# Patient Record
Sex: Male | Born: 1985 | Race: White | Hispanic: No | Marital: Single
Health system: Midwestern US, Community
[De-identification: ages and names within clinical notes are randomized; demographics above are authoritative.]

---
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
## 2021-08-08 DIAGNOSIS — R062 Wheezing: Secondary | ICD-10-CM

## 2021-08-08 NOTE — ED Provider Notes (Cosign Needed)
HPI  36 year old male.  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  Feels like he has been wheezing since working on his farm yesterday.  Does not have an albuterol inhaler at home.  Otherwise denies chest pain, fever/chills, GI symptoms.    No past medical history on file.     No past surgical history on file.        No family history on file.      Social History     Socioeconomic History    Marital status: Single     Spouse name: Not on file    Number of children: Not on file    Years of education: Not on file    Highest education level: Not on file   Occupational History    Not on file   Tobacco Use    Smoking status: Not on file    Smokeless tobacco: Not on file   Substance and Sexual Activity    Alcohol use: Not on file    Drug use: Not on file    Sexual activity: Not on file   Other Topics Concern    Not on file   Social History Narrative    Not on file     Social Determinants of Health     Financial Resource Strain: Not on file   Food Insecurity: Not on file   Transportation Needs: Not on file   Physical Activity: Not on file   Stress: Not on file   Social Connections: Not on file   Intimate Partner Violence: Not on file   Housing Stability: Not on file         ALLERGIES: Patient has no known allergies.      Vitals:    08/08/21 2346 08/08/21 2347   BP:  (!) 141/88   Pulse:  89   Resp:  20   Temp:  98 F (36.7 C)   TempSrc:  Oral   SpO2:  97%   Weight: 210 lb (95.3 kg)    Height: 6\' 2"  (1.88 m)             Physical Exam   Gen: Well-appearing male in no acute distress  Card: Regular rate and rhythm  Pulm: No respiratory distress.  Mild end expiratory wheezes in upper lung fields bilaterally.  Abd: Soft, nontender  Extremities: Moving all extremities  Skin: No skin rash    Medical Decision Making  Risk  Prescription drug management.      36 year old male presents with some mild wheezing.  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    Vital signs normal  No respiratory distress.  Has mild end expiratory wheezing in his upper lung fields.    Given DuoNeb treatment with resolution of his wheezing.  Given oral steroids and discharged home with prescription for steroids as well as albuterol inhaler.         Procedures                                   31, MD  Emergency Medicine PGY-4       Rogelia Boga, MD  Resident  08/09/21 [phone]

## 2021-08-09 ENCOUNTER — Inpatient Hospital Stay
Admit: 2021-08-09 | Discharge: 2021-08-09 | Disposition: A | Discharge status: Home / Self Care | Attending: Emergency Medicine

## 2021-08-09 MED ORDER — ALBUTEROL SULFATE HFA 108 (90 BASE) MCG/ACT IN AERS
108 (90 Base) MCG/ACT | Freq: Four times a day (QID) | RESPIRATORY_TRACT | 1 refills | Status: AC | PRN
Start: 2021-08-09 — End: ?

## 2021-08-09 MED ORDER — SPACER/AERO-HOLDING CHAMBERS DEVI
Freq: Every day | 0 refills | Status: AC | PRN
Start: 2021-08-09 — End: ?

## 2021-08-09 MED ORDER — DEXAMETHASONE 4 MG PO TABS
4 MG | Freq: Once | ORAL | Status: AC
Start: 2021-08-09 — End: 2021-08-09
  Administered 2021-08-09: 05:00:00 8 mg via ORAL

## 2021-08-09 MED ORDER — IPRATROPIUM-ALBUTEROL 0.5-2.5 (3) MG/3ML IN SOLN
Freq: Once | RESPIRATORY_TRACT | Status: AC
Start: 2021-08-09 — End: 2021-08-09
  Administered 2021-08-09: 05:00:00 1 via RESPIRATORY_TRACT

## 2021-08-09 MED ORDER — DEXAMETHASONE 4 MG PO TABS
4 MG | ORAL_TABLET | Freq: Two times a day (BID) | ORAL | 0 refills | Status: AC
Start: 2021-08-09 — End: 2021-08-10

## 2021-08-09 MED FILL — IPRATROPIUM-ALBUTEROL 0.5-2.5 (3) MG/3ML IN SOLN: RESPIRATORY_TRACT | Qty: 3

## 2021-08-09 MED FILL — DEXAMETHASONE 4 MG PO TABS: 4 MG | ORAL | Qty: 2

## 2021-08-09 NOTE — ED Notes (Signed)
Pt presents for wheezing with a hx of asthma       Vic Ripper, RN  08/09/21 [phone]
